# Patient Record
Sex: Male | Born: 2000 | Race: White | Hispanic: No | Marital: Single | Smoking: Never smoker
Health system: Southern US, Community
[De-identification: ages and names within clinical notes are randomized; demographics above are authoritative.]

---
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
## 2018-02-14 ENCOUNTER — Ambulatory Visit (INDEPENDENT_AMBULATORY_CARE_PROVIDER_SITE_OTHER): Payer: Self-pay | Admitting: Family Medicine

## 2018-02-14 DIAGNOSIS — N476 Balanoposthitis: Secondary | ICD-10-CM

## 2018-02-14 NOTE — Progress Notes (Signed)
     Subjective: No chief complaint on file.   HPI: Alex Foley is a 17 y.o. presenting to clinic today to discuss the following:  Penile Swelling Patient presents stating he had penile redness and swelling that began Saturday and self-resolved on Monday. He states it is better now but kept his appointment today to make sure everything is ok. He stated it did feel warm and he reported a subjective fever but no pain, no discharge, no sexual activity. He states he used ice and ibuprofen and it seemed to help. No injuries in the area and no testicular pain or swelling.  Patient states he does not think he can fully retract his foreskin.  Health Maintenance: none     ROS noted in HPI.   Past Medical, Surgical, Social, and Family History Reviewed & Updated per EMR.   Pertinent Historical Findings include:   Social History   Tobacco Use  Smoking Status Never Smoker  Smokeless Tobacco Never Used    Objective: BP 120/82 (BP Location: Right Arm, Patient Position: Sitting, Cuff Size: Large)   Pulse 90   Temp 98.5 F (36.9 C) (Oral)   Ht 5\' 10"  (1.778 m)   Wt (!) 309 lb (140.2 kg)   SpO2 99%   BMI 44.34 kg/m  Vitals and nursing notes reviewed  Physical Exam Gen: Alert and Oriented x 3, NAD HEENT: Normocephalic, atraumatic, PERRLA, EOMI CV: RRR, no murmurs, normal S1, S2 split Resp: CTAB, no wheezing, rales, or rhonchi, comfortable work of breathing GU: No penile swelling, erythema, discharge, or odor noted. Penis is uncircumcised Ext: no clubbing, cyanosis, or edema Skin: warm, dry, intact, no rashes   No results found for this or any previous visit (from the past 72 hour(s)).  Assessment/Plan:  Balanoposthitis Patient symptoms have completely resolved but given history and description of event with history of difficulty retracting his foreskin balanoposthitis seems most likely. Advised patient to work on getting his foreskin fully retracted and to ensure he is  cleaning the area well during showers. Good hygiene should prevent further issues however if it occurs again he may benefit from antibiotic ointment and if continues to fail to have full retraction of foreskin he may benefit from seeing urology.   PATIENT EDUCATION PROVIDED: See AVS    Diagnosis and plan along with any newly prescribed medication(s) were discussed in detail with this patient today. The patient verbalized understanding and agreed with the plan. Patient advised if symptoms worsen return to clinic or ER.   Health Maintainance:   No orders of the defined types were placed in this encounter.   No orders of the defined types were placed in this encounter.    Jules Schick, DO 02/15/2018, 10:12 PM PGY-2 [facility]

## 2018-02-14 NOTE — Patient Instructions (Signed)
It was great to meet you today! Thank you for letting me participate in your care!  Today, we discussed your symptoms that have since self-resolved. I believe what you had is called balanoposthitis. Below is more information on how to manage it and prevent it. If the issue returns please come back into the clinic.   Balanitis Balanitis is swelling and irritation (inflammation) of the head of the penis (glans penis). The condition may also cause inflammation of the skin around the glans penis (foreskin) in men who have not been circumcised. It may develop because of an infection or another medical condition. Balanitis occurs most often among men who have not had their foreskin removed (uncircumcised men). Balanitis sometimes causes scarring of the penis or foreskin, which can require surgery. Untreated balanitis can increase the risk of penile cancer. What are the causes? Common causes of this condition include:  Poor personal hygiene, especially in uncircumcised men. Not cleaning the glans penis and foreskin well can result in buildup of bacteria, viruses, and yeast, which can lead to infection and inflammation.  Irritation and lack of air flow due to fluid (smegma) that can build up on the glans penis.  Other causes include:  Chemical irritation from products such as soaps or shower gels (especially those that have fragrance), condoms, personal lubricants, petroleum jelly, spermicides, or fabric softeners.  Skin conditions, such as eczema, dermatitis, and psoriasis.  Allergies to medicines, such as tetracycline and sulfa drugs.  Certain medical conditions, including liver cirrhosis, congestive heart failure, diabetes, and kidney disease.  Infections, such as candidiasis, HPV (human papillomavirus), herpes simplex, gonorrhea, and syphilis.  Severe obesity.  What increases the risk? The following factors may make you more likely to develop this condition:  Having diabetes. This is the  most common risk factor.  Having a tight foreskin that is difficult to pull back (retract) past the glans.  Having sexual intercourse without using a condom.  What are the signs or symptoms? Symptoms of this condition include:  Discharge from under the foreskin.  A bad smell.  Pain or difficulty retracting the foreskin.  Tenderness, redness, and swelling of the glans.  A rash or sores on the glans or foreskin.  Itchiness.  Inability to get an erection due to pain.  Difficulty urinating.  Scarring of the penis or foreskin, in some cases.  How is this diagnosed? This condition may be diagnosed based on:  A physical exam.  Testing a swab of discharge to check for bacterial or fungal infection.  Blood tests: ? To check for viruses that can cause balanitis. ? To check your blood sugar (glucose) level. High blood glucose could be a sign of diabetes, which can cause balanitis.  How is this treated? Treatment for balanitis depends on the cause. Treatment may include:  Improving personal hygiene. Your health care provider may recommend sitting in a bath of warm water that is deep enough to cover your hips and buttocks (sitz bath).  Medicines such as: ? Creams or ointments to reduce swelling (steroids) or to treat an infection. ? Antibiotic medicine. ? Antifungal medicine.  Surgery to remove or cut the foreskin (circumcision). This may be done if you have scarring on the foreskin that makes it difficult to retract.  Controlling other medical problems that may be causing your condition or making it worse.  Follow these instructions at home:  Do not have sex until the condition clears up, or until your health care provider approves.  Keep your penis  clean and dry. Take sitz baths as recommended by your health care provider.  Avoid products that irritate your skin or make symptoms worse, such as soaps and shower gels that have fragrance.  Take over-the-counter and  prescription medicines only as told by your health care provider. ? If you were prescribed an antibiotic medicine or a cream or ointment, use it as told by your health care provider. Do not stop using your medicine, cream, or ointment even if you start to feel better. ? Do not drive or use heavy machinery while taking prescription pain medicine. Contact a health care provider if:  Your symptoms get worse or do not improve with home care.  You develop chills or a fever.  You have trouble urinating.  You cannot retract your foreskin. Get help right away if:  You develop severe pain.  You are unable to urinate. Summary  Balanitis is inflammation of the head of the penis (glans penis) caused by irritation or infection.  Balanitis causes pain, redness, and swelling of the glans penis.  This condition is most common among uncircumcised men who do not keep their glans penis clean and in men who have diabetes.  Treatment may include creams or ointments.  Good hygiene is important for prevention. This includes pulling back the foreskin when washing your penis. This information is not intended to replace advice given to you by your health care provider. Make sure you discuss any questions you have with your health care provider. Document Released: 10/16/2008 Document Revised: 04/18/2016 Document Reviewed: 04/18/2016 Elsevier Interactive Patient Education  2017 [employer].    Be well, Jules Schick, DO PGY-2, [facility]

## 2018-02-15 DIAGNOSIS — N476 Balanoposthitis: Secondary | ICD-10-CM | POA: Insufficient documentation

## 2018-02-15 NOTE — Assessment & Plan Note (Signed)
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 Advised patient to work on getting his foreskin fully retracted and to ensure he is cleaning the area well during showers. Good hygiene should prevent further issues however if it occurs again he may benefit from antibiotic ointment and if continues to fail to have full retraction of foreskin he may benefit from seeing urology.

## 2018-03-08 ENCOUNTER — Encounter: Payer: Self-pay | Admitting: Family Medicine

## 2018-03-08 ENCOUNTER — Other Ambulatory Visit: Payer: Self-pay

## 2018-03-08 ENCOUNTER — Ambulatory Visit (INDEPENDENT_AMBULATORY_CARE_PROVIDER_SITE_OTHER): Payer: No Typology Code available for payment source | Admitting: Family Medicine

## 2018-03-08 DIAGNOSIS — N471 Phimosis: Secondary | ICD-10-CM | POA: Insufficient documentation

## 2018-03-08 DIAGNOSIS — H6122 Impacted cerumen, left ear: Secondary | ICD-10-CM

## 2018-03-08 DIAGNOSIS — Z23 Encounter for immunization: Secondary | ICD-10-CM | POA: Diagnosis not present

## 2018-03-08 NOTE — Progress Notes (Signed)
   Subjective:   Patient ID: Alex Foley    DOB: 2001/03/10, 17 y.o. male   MRN: [id number]  Chief Complaint: penile discharge   History of Present Illness: Alex Foley is a 17 y.o. male with history of obesity who presents to clinic today complaining of pure white discharge for about 1-2 years now. Patient is concerned that this is not normal and would like to know what it is. Patient states that the discharge is different from his ejaculation; it collects around the head of his penis (uncircumcised) and he has to wipe it away with tissue.   Patient was seen 02/14/2018, about two weeks ago for redness and swelling to glans penis. Patient reports a recurrent episode about one week ago that has resolved. Dr. Karen Foley counseled the patient on hygiene and working to retract his foreskin. Patient states that he has been unable to since his visit; states that he has never been able to fully retract his foreskin.   Patient denies new sexual partners or risk of STD exposure. Patient denies fever, chills, abdominal pain, dysuria, hematuria, urgency, frequency, rash or other lesions to his penis.   Tinnitus Muffled sound and ringing in ears since a beach trip  Patient  Patient admits to using q tips to clean his ear   Review of Systems  Constitutional: Negative for fever.  Gastrointestinal: Negative for abdominal pain, nausea and vomiting.  Genitourinary: Negative for dysuria, frequency, hematuria and urgency.  Skin: Negative for rash.    PMFSH: see epic  No past medical history on file.   Current Outpatient Medications  Medication Sig Dispense Refill  . fexofenadine (ALLEGRA) 180 MG tablet Take 180 mg by mouth daily.    . Selenium Sulfide 2.3 % SHAM Apply 10 mLs topically 2 (two) times a week. 180 mL 3   No current facility-administered medications for this visit.     Objective:  BP 124/76   Pulse 77   Temp 98.7 F (37.1 C) (Oral)   Ht 5\' 10"  (1.778 m)   Wt (!) 312 lb 12.8 oz  (141.9 kg)   SpO2 97%   BMI 44.88 kg/m   Physical Exam  Constitutional: He appears well-developed and well-nourished.  Cardiovascular: Normal rate.  Pulmonary/Chest: Effort normal.    Assessment & Plan:  NABOR THOMANN is a 17 y.o. male who presents to clinic today with phimosis and with a cerumen impaction.  No problem-specific Assessment & Plan notes found for this encounter.  No orders of the defined types were placed in this encounter.  No orders of the defined types were placed in this encounter.   Alex Foley, Medical Student  [facility] 03/08/2018 12:09 PM   I was physically present and or repeated all elements of the H&PE of Alex Foley.  I agree with her documentation and management.  This is a problem focused visit, not a wellness visit.    Alex Foley admantly states he has never had sex so STDs are out of consideration.  He has never been able to retract his foreskin.  I verified on exam.  I could not seen the glans at all.  There is no redness externally nor signs of acute infection  I was able to irrigate the ear clear of a cerumen impaction.  He felt good immediate relief.

## 2018-03-08 NOTE — Patient Instructions (Signed)
I hope that the clean out has solved your ear problem.  Call me in a week or so if you still have ringing or problems.  I would likely add some flonase. I sent in a referral to a urologist.  Someone should call.  If you want to google in advance, the official diagnosis is phimosis.

## 2018-03-09 ENCOUNTER — Encounter: Payer: Self-pay | Admitting: Family Medicine

## 2018-03-09 DIAGNOSIS — H6120 Impacted cerumen, unspecified ear: Secondary | ICD-10-CM | POA: Insufficient documentation

## 2018-03-09 NOTE — Assessment & Plan Note (Signed)
Irrigated left ear clear.  He may have an element of eustacian tube dysfunction.  Return for likely nasal steroids if symptoms persist.

## 2018-03-09 NOTE — Assessment & Plan Note (Signed)
Likely also mild balanitis.  Cannot use topicals because he can't retract.  Warned to not attempt retraction.  Urology referral.

## 2018-04-26 MED FILL — CLOTRIMAZOLE-BETAMETHASONE: 1-0.05 | 10 days supply | Qty: 15 | Fill #0

## 2018-05-25 MED FILL — CHLORHEXIDINE 0.12% RINSE: 0.12 | 14 days supply | Qty: 473 | Fill #0

## 2018-05-25 MED FILL — IBUPROFEN 600 MG TABLET: 600 | 7 days supply | Qty: 30 | Fill #0

## 2018-05-25 MED FILL — HYDROCODON-APAP 5-325: 5-325 | 5 days supply | Qty: 20 | Fill #0

## 2018-05-25 MED FILL — PENICILLIN VK 500 MG TABLET: 500 | 14 days supply | Qty: 56 | Fill #0

## 2019-04-10 ENCOUNTER — Ambulatory Visit (INDEPENDENT_AMBULATORY_CARE_PROVIDER_SITE_OTHER)
Admission: RE | Admit: 2019-04-10 | Discharge: 2019-04-10 | Disposition: A | Discharge status: Home / Self Care | Payer: No Typology Code available for payment source | Source: Ambulatory Visit

## 2019-04-10 ENCOUNTER — Other Ambulatory Visit: Payer: Self-pay

## 2019-04-10 ENCOUNTER — Telehealth: Payer: No Typology Code available for payment source

## 2019-04-10 DIAGNOSIS — J069 Acute upper respiratory infection, unspecified: Secondary | ICD-10-CM | POA: Diagnosis not present

## 2019-04-10 DIAGNOSIS — Z20822 Contact with and (suspected) exposure to covid-19: Secondary | ICD-10-CM

## 2019-04-10 DIAGNOSIS — Z20828 Contact with and (suspected) exposure to other viral communicable diseases: Secondary | ICD-10-CM | POA: Diagnosis not present

## 2019-04-10 MED ORDER — BENZONATATE 100 MG PO CAPS: 100.0000 mg | ORAL_CAPSULE | Freq: Three times a day (TID) | ORAL | 0 refills | Status: DC

## 2019-04-10 MED ORDER — FLUTICASONE PROPIONATE 50 MCG/ACT NA SUSP: 2.0000 | Freq: Every day | NASAL | 0 refills | Status: DC

## 2019-04-10 MED ORDER — CETIRIZINE-PSEUDOEPHEDRINE ER 5-120 MG PO TB12: 1.0000 | ORAL_TABLET | Freq: Every day | ORAL | 0 refills | Status: DC

## 2019-04-10 MED FILL — BENZONATATE 100 MG CAPS: 100 | 7 days supply | Qty: 21 | Fill #0

## 2019-04-10 MED FILL — ZYRTEC-D TABLET: 5-120 | 24 days supply | Qty: 24 | Fill #0

## 2019-04-10 NOTE — Discharge Instructions (Signed)
COVID testing ordered.  Go to the drive-thru testing site at [facility]. They are open M-F 8- 3:45  In the meantime: You should remain isolated in your home for 10 days from symptom onset AND greater than 72 hours after symptoms resolution (absence of fever without the use of fever-reducing medication and improvement in respiratory symptoms), whichever is longer Get plenty of rest and push fluids Tessalon Perles prescribed for cough Zyrtec-D prescribed for nasal congestion, runny nose, and/or sore throat Flonase prescribed for nasal congestion and runny nose Use medications daily for symptom relief Use OTC medications like ibuprofen or tylenol as needed fever or pain Call or go to the ED if you have any new or worsening symptoms such as fever, worsening cough, shortness of breath, chest tightness, chest pain, turning blue, changes in mental status, etc..Marland Kitchen

## 2019-04-10 NOTE — ED Provider Notes (Signed)
[facility] Virtual Visit via Video Note:  BURDETTE FOREHAND  initiated request for Telemedicine visit with [facility] Urgent [facility] team. I connected with Kerry Kass  on 04/10/2019 at 12:10 PM  for a synchronized telemedicine visit using a video enabled HIPPA compliant telemedicine application. I verified that I am speaking with Kerry Kass  using two identifiers. Rennis Harding, PA-C  was physically located in a [facility] Urgent care site and ELIGHA KMETZ was located at a different location.   The limitations of evaluation and management by telemedicine as well as the availability of in-person appointments were discussed. Patient was informed that he  may incur a bill ( including co-pay) for this virtual visit encounter. CANYON LOHR  expressed understanding and gave verbal consent to proceed with virtual visit.   [id number] 04/10/19 Arrival Time: 1157  CC: URI symptoms  SUBJECTIVE: History from: patient.  Alex Foley is a 18 y.o. male who presents with sneezing, congestion, dry cough, and sore throat x 2 days.  Denies sick exposure to COVID, flu or strep.  Denies recent travel.   Has tried benadryl and sinus medication with minimal relief.  Denies aggravating factors.  Denies previous symptoms in the past.   Reports fever with tmax of 100.2 today, and fatigue. Denies chills, SOB, wheezing, chest pain, nausea, vomiting, changes in bowel or bladder habits.    ROS: As per HPI.  All other pertinent ROS negative.     History reviewed. No pertinent past medical history. Past Surgical History:  Procedure Laterality Date  . CHOLECYSTECTOMY N/A 07/02/2017   Procedure: LAPAROSCOPIC CHOLECYSTECTOMY WITH INTRAOPERATIVE CHOLANGIOGRAM;  Surgeon: Ovidio Kin, MD;  Location: [facility];  Service: General;  Laterality: N/A;   No Known Allergies No current facility-administered medications on file prior to encounter.    Current Outpatient Medications on File Prior to Encounter   Medication Sig Dispense Refill  . fexofenadine (ALLEGRA) 180 MG tablet Take 180 mg by mouth daily.    . Selenium Sulfide 2.3 % SHAM Apply 10 mLs topically 2 (two) times a week. 180 mL 3    OBJECTIVE:  There were no vitals filed for this visit.  General appearance: alert; appears mildly fatigued, but nontoxic Eyes: EOMI grossly HENT: normocephalic; atraumatic Neck: supple with FROM Lungs: normal respiratory effort; speaking in full sentences without difficulty; mild cough present Extremities: moves extremities without difficulty Skin: No obvious rashes Neurologic: No facial asymmetries Psychological: alert and cooperative; normal mood and affect  ASSESSMENT & PLAN:  1. Encounter by telehealth for suspected COVID-19   2. Viral URI with cough     Meds ordered this encounter  Medications  . cetirizine-pseudoephedrine (ZYRTEC-D) 5-120 MG tablet    Sig: Take 1 tablet by mouth daily.    Dispense:  30 tablet    Refill:  0    Order Specific Question:   Supervising Provider    Answer:   Eustace Moore [[id number]]  . fluticasone (FLONASE) 50 MCG/ACT nasal spray    Sig: Place 2 sprays into both nostrils daily.    Dispense:  16 g    Refill:  0    Order Specific Question:   Supervising Provider    Answer:   Eustace Moore [[id number]]  . benzonatate (TESSALON) 100 MG capsule    Sig: Take 1 capsule (100 mg total) by mouth every 8 (eight) hours.    Dispense:  21 capsule    Refill:  0    Order  Specific Question:   Supervising Provider    Answer:   Raylene Everts [[id number]]   COVID testing ordered.  Go to the drive-thru testing site at [place]. They are open M-F 8- 3:45  In the meantime: You should remain isolated in your home for 10 days from symptom onset AND greater than 72 hours after symptoms resolution (absence of fever without the use of fever-reducing medication and improvement in respiratory symptoms), whichever is longer Get plenty of rest and push fluids  Tessalon Perles prescribed for cough Zyrtec-D prescribed for nasal congestion, runny nose, and/or sore throat Flonase prescribed for nasal congestion and runny nose Use medications daily for symptom relief Use OTC medications like ibuprofen or tylenol as needed fever or pain Call or go to the ED if you have any new or worsening symptoms such as fever, worsening cough, shortness of breath, chest tightness, chest pain, turning blue, changes in mental status, etc...   I discussed the assessment and treatment plan with the patient. The patient was provided an opportunity to ask questions and all were answered. The patient agreed with the plan and demonstrated an understanding of the instructions.   The patient was advised to call back or seek an in-person evaluation if the symptoms worsen or if the condition fails to improve as anticipated.  I provided 10 minutes of non-face-to-face time during this encounter.  [place], PA-C  04/10/2019 12:10 PM          Stacey Drain, [place], PA-C 04/10/19 1210

## 2019-04-11 LAB — NOVEL CORONAVIRUS, NAA: SARS-CoV-2, NAA: NOT DETECTED

## 2019-08-19 ENCOUNTER — Encounter: Payer: Self-pay | Admitting: Family Medicine

## 2019-08-19 ENCOUNTER — Ambulatory Visit (INDEPENDENT_AMBULATORY_CARE_PROVIDER_SITE_OTHER): Payer: No Typology Code available for payment source | Admitting: Family Medicine

## 2019-08-19 ENCOUNTER — Other Ambulatory Visit: Payer: Self-pay

## 2019-08-19 DIAGNOSIS — F339 Major depressive disorder, recurrent, unspecified: Secondary | ICD-10-CM

## 2019-08-19 DIAGNOSIS — Z9189 Other specified personal risk factors, not elsewhere classified: Secondary | ICD-10-CM | POA: Insufficient documentation

## 2019-08-19 MED ORDER — FLUOXETINE HCL 20 MG PO TABS: 20.0000 mg | ORAL_TABLET | Freq: Every day | ORAL | 3 refills | Status: AC

## 2019-08-19 MED FILL — FLUOXETINE HCL 20 MG TABS: 20 | 30 days supply | Qty: 30 | Fill #0

## 2019-08-19 NOTE — Assessment & Plan Note (Signed)
We discussed being more physically active to help both depression and obesity.

## 2019-08-19 NOTE — Progress Notes (Signed)
    SUBJECTIVE:   CHIEF COMPLAINT / HPI:   Depression: Patient has a history of emotional problems dating back to 2014 - see note of 11/16/2012 with significant school avoidance.  He refused suggested counseling.  With mother, we agree to discuss further in 2 weeks.  My next visit was three years later. He relates current decline to GB surg 06/2017.  Never felt right since.  He enrolled in college, failed two semisters ("I couldn't get motivated to study), has dropped out.  He is not looking for employment or further education at this time.  He spends all day on the computer gaming or in a chat room.   PHQ-9 is 20 and endorses suicidal thoughts.  He has never acted on those thoughts and has no plan.  He now recognizes that he has a problem and is willing to seek help Also note that he has gained 40 more pounds since his GB surgery and now has a BMI=51.    OBJECTIVE:   BP (!) 142/88   Pulse 87   Wt (!) 355 lb 12.8 oz (161.4 kg)   SpO2 97%   BMI 51.05 kg/m   Good eye contact.  Answers questions but does not volunteer expansive answers.  Normal cognition and thought process throughout.  ASSESSMENT/PLAN:   Major depression, recurrent, chronic (HCC) On the good side, he now accepts that he has a problem and is asking for help. On the bad side, this is a long-standing, entrenched problem that will not resolve easily.  He now buys into the combination of medications and counseling.  I will start him on medications.  Referred to counseling.  He promises to make an appointment within two weeks.  He also promises to contact me immediately if self harm thoughts increase.  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  Both make you feel good in the moment but add to your long term problems.  Morbid obesity (HCC) We discussed being more physically active to help both depression and obesity.       Moses Manners, MD [facility]

## 2019-08-19 NOTE — Patient Instructions (Addendum)
Your official diagnosis is major depression. I sent in a medication to the pharmacy.  As we discussed, it will take a while to kick in. I will give you some counseling resources.  Your mom will have other counseling resources available through work.  I do care where you go - just go somewhere.  Get started within the next two weeks. See me in 2-3 weeks to let me know how it is going. I do think you should plan on long term counseling, not short term.  MUCH less time on the computer.  Remember the comparison between computer and the alcoholic - it makes you feel good in the moment, but adds to your long term problems.    Resources: Outpatient Mental Health Providers  (No Insurance at time of Visit or Self Pay)   [facility] Mon-Fri, 8:30-5:00  [address] [place], [place] [address]  [phone] [email or web address]  [phone] (Immediate assistance)   [facility]  Walk-in Mon-Fri, 8am-3pm  [address] [place], [place] [phone] [email or web address]   [facility] [place] (Habla Espanol) walk in M-F 8am-12pm and 1pm-3pm  [place]- [address]   [phone]  [employer] [address] Phone: [phone]   [facility] (Mental Health and substance challenges)  [address]  [place] [phone]  [email or web address]    [facility]  [place] -[address], [place]   Phone: [phone]  [facility]- [address] [phone]   [facility] Walk-in MWF 12:30 to 3:00    [address] [place] [address] [phone] [email or web address]  call to schedule an appointment    [facility] Classes ,Support group, Peer support services,  [address], [place], [place] [address] [phone] PhotoSolver.pl      [facility] on Mental Illness (NAMI) [place]- Wellness classes, Support groups    [address],  [place], [place] [address] [phone]  [email or web address]  [facility] (Psycho-social Rehabilitation clubhouse, Individual and group therapy)  [address] [place], [place] [address]  [phone]- [phone]   24- Hour Availability:  [facility] [phone] or [phone]  * [facility] [employer] (Domestic Violence, Rape, etc. )[phone]  Vesta [employer]er [phone] or [phone]  * [facility] [place] [facility] [phone] only) [phone] (after hours)  *[facility] Mobile Crisis Unit [phone]  *[place] National Suicide Hotline [phone] Len Childs)

## 2019-08-19 NOTE — Assessment & Plan Note (Addendum)
On the good side, he now accepts that he has a problem and is asking for help. On the bad side, this is a long-standing, entrenched problem that will not resolve easily.  He now buys into the combination of medications and counseling.  I will start him on medications.  Referred to counseling.  He promises to make an appointment within two weeks.  He also promises to contact me immediately if self harm thoughts increase.

## 2019-08-19 NOTE — Assessment & Plan Note (Signed)
We discussed the comparison between alcoholism and gaming addiction.  Both make you feel good in the moment but add to your long term problems.

## 2019-09-27 MED FILL — FLUOXETINE HCL 20 MG TABS: 20 | 30 days supply | Qty: 30 | Fill #1

## 2019-11-12 ENCOUNTER — Other Ambulatory Visit: Payer: Self-pay

## 2019-11-12 ENCOUNTER — Telehealth (INDEPENDENT_AMBULATORY_CARE_PROVIDER_SITE_OTHER): Payer: No Typology Code available for payment source | Admitting: Family Medicine

## 2019-11-12 DIAGNOSIS — J069 Acute upper respiratory infection, unspecified: Secondary | ICD-10-CM

## 2019-11-12 NOTE — Progress Notes (Signed)
[facility] Telemedicine Visit  Patient consented to have virtual visit and was identified by name and date of birth. Method of visit: Video  Encounter participants: Patient: Alex Foley - located at home Provider: Levert Feinstein - located at office Others (if applicable): n/a  Chief Complaint: URI sx  HPI:  Patient seen via video visit for cold symptoms. Much of his family was sick last week and he now has symptoms. Has cough, sore throat, sneezing, and drainage from nose. Checked temp and had fever of 100.4. has been sick for 4-5 days. Has tried taking over the counter cough medicine and cough drops which help temporarily. Also tried Financial [employer]. Denies chest pain (other than mild with cough) or difficulty breathing.  Has not yet been vaccinated against COVID but plans to be vaccinated after this illness passes. Sore throat is most bothersome symptom currently.  ROS: per HPI  Pertinent PMHx: depression, otherwise healthy  Exam:  Gen: no acute distress, pleasant, cooperative, overall well appearing Respiratory: Patient speaking normally in full sentences throughout the encounter, without any respiratory distress evident.  HEENT: frequent sniffling of nasal drainage  Assessment/Plan:  Viral URI Almost certainly viral. Recommend COVID testing - [facility] as [facility] COVID testing site is not open today. Advised PCR test, not rapid antigen testing. For symptom control, recommend ibuprofen for sore throat and afrin for nasal congestion.  If PCR COVID test negative and patient still feeling bad in a day or two, can come in and be seen in the office if needed. Advised to call back if worsening. Patient agreeable.  Time spent during visit with patient: 13 minutes

## 2019-11-15 MED FILL — FLUOXETINE HCL 20 MG TABS: 20 | 30 days supply | Qty: 30 | Fill #2

## 2020-01-02 ENCOUNTER — Ambulatory Visit: Payer: No Typology Code available for payment source | Admitting: Psychology

## 2020-01-09 ENCOUNTER — Ambulatory Visit (INDEPENDENT_AMBULATORY_CARE_PROVIDER_SITE_OTHER): Payer: No Typology Code available for payment source | Admitting: Psychology

## 2020-01-09 DIAGNOSIS — F332 Major depressive disorder, recurrent severe without psychotic features: Secondary | ICD-10-CM | POA: Diagnosis not present

## 2020-01-22 ENCOUNTER — Ambulatory Visit (INDEPENDENT_AMBULATORY_CARE_PROVIDER_SITE_OTHER): Payer: No Typology Code available for payment source | Admitting: Psychology

## 2020-01-22 DIAGNOSIS — F332 Major depressive disorder, recurrent severe without psychotic features: Secondary | ICD-10-CM | POA: Diagnosis not present

## 2020-02-05 ENCOUNTER — Ambulatory Visit (INDEPENDENT_AMBULATORY_CARE_PROVIDER_SITE_OTHER): Payer: No Typology Code available for payment source | Admitting: Psychology

## 2020-02-05 DIAGNOSIS — F332 Major depressive disorder, recurrent severe without psychotic features: Secondary | ICD-10-CM

## 2020-02-20 ENCOUNTER — Ambulatory Visit (INDEPENDENT_AMBULATORY_CARE_PROVIDER_SITE_OTHER): Payer: No Typology Code available for payment source | Admitting: Psychology

## 2020-02-20 DIAGNOSIS — F332 Major depressive disorder, recurrent severe without psychotic features: Secondary | ICD-10-CM | POA: Diagnosis not present

## 2020-03-05 ENCOUNTER — Ambulatory Visit (INDEPENDENT_AMBULATORY_CARE_PROVIDER_SITE_OTHER): Payer: No Typology Code available for payment source | Admitting: Psychology

## 2020-03-05 DIAGNOSIS — F332 Major depressive disorder, recurrent severe without psychotic features: Secondary | ICD-10-CM

## 2020-03-12 MED FILL — FLUOXETINE HCL 20 MG TABS: 20 | 30 days supply | Qty: 30 | Fill #3

## 2020-03-19 ENCOUNTER — Ambulatory Visit (INDEPENDENT_AMBULATORY_CARE_PROVIDER_SITE_OTHER): Payer: Self-pay | Admitting: Psychology

## 2020-03-19 DIAGNOSIS — F332 Major depressive disorder, recurrent severe without psychotic features: Secondary | ICD-10-CM

## 2020-04-02 ENCOUNTER — Ambulatory Visit (INDEPENDENT_AMBULATORY_CARE_PROVIDER_SITE_OTHER): Payer: Self-pay | Admitting: Psychology

## 2020-04-02 DIAGNOSIS — F332 Major depressive disorder, recurrent severe without psychotic features: Secondary | ICD-10-CM

## 2020-04-13 ENCOUNTER — Ambulatory Visit: Payer: Self-pay | Admitting: Psychology

## 2020-04-22 ENCOUNTER — Ambulatory Visit (INDEPENDENT_AMBULATORY_CARE_PROVIDER_SITE_OTHER): Payer: Self-pay | Admitting: Psychology

## 2020-04-22 DIAGNOSIS — F332 Major depressive disorder, recurrent severe without psychotic features: Secondary | ICD-10-CM

## 2020-06-03 ENCOUNTER — Ambulatory Visit (INDEPENDENT_AMBULATORY_CARE_PROVIDER_SITE_OTHER): Payer: Self-pay | Admitting: Psychology

## 2020-06-03 DIAGNOSIS — F332 Major depressive disorder, recurrent severe without psychotic features: Secondary | ICD-10-CM

## 2020-06-18 ENCOUNTER — Ambulatory Visit: Payer: Self-pay | Admitting: Psychology

## 2020-06-25 ENCOUNTER — Other Ambulatory Visit: Payer: Self-pay

## 2020-11-12 ENCOUNTER — Other Ambulatory Visit (HOSPITAL_COMMUNITY): Payer: Self-pay

## 2020-11-12 ENCOUNTER — Ambulatory Visit
Admission: EM | Admit: 2020-11-12 | Discharge: 2020-11-12 | Disposition: A | Discharge status: Home / Self Care | Payer: Self-pay | Attending: Emergency Medicine | Admitting: Emergency Medicine

## 2020-11-12 DIAGNOSIS — W540XXA Bitten by dog, initial encounter: Secondary | ICD-10-CM

## 2020-11-12 DIAGNOSIS — R062 Wheezing: Secondary | ICD-10-CM

## 2020-11-12 DIAGNOSIS — Z23 Encounter for immunization: Secondary | ICD-10-CM

## 2020-11-12 DIAGNOSIS — S51851A Open bite of right forearm, initial encounter: Secondary | ICD-10-CM

## 2020-11-12 MED ORDER — AMOXICILLIN-POT CLAVULANATE 875-125 MG PO TABS
1.0000 | ORAL_TABLET | Freq: Two times a day (BID) | ORAL | 0 refills | Status: AC
  Filled 2020-11-12: qty 14, 7d supply, fill #0

## 2020-11-12 MED ORDER — ALBUTEROL SULFATE HFA 108 (90 BASE) MCG/ACT IN AERS
1.0000 | INHALATION_SPRAY | Freq: Four times a day (QID) | RESPIRATORY_TRACT | 0 refills | Status: AC | PRN
  Filled 2020-11-12: qty 18, 25d supply, fill #0

## 2020-11-12 MED ORDER — TETANUS-DIPHTH-ACELL PERTUSSIS 5-2.5-18.5 LF-MCG/0.5 IM SUSY
0.5000 mL | PREFILLED_SYRINGE | Freq: Once | INTRAMUSCULAR | Status: AC
  Administered 2020-11-12: 0.5 mL via INTRAMUSCULAR

## 2020-11-12 MED ORDER — IBUPROFEN 800 MG PO TABS
800.0000 mg | ORAL_TABLET | Freq: Three times a day (TID) | ORAL | 0 refills | Status: AC
  Filled 2020-11-12: qty 21, 7d supply, fill #0

## 2020-11-12 NOTE — ED Notes (Signed)
Patient given Tdap in Right Deltoid.  Tolerated well.

## 2020-11-12 NOTE — ED Provider Notes (Signed)
[facility]    CSN: [id number] Arrival date & time: 11/12/20  6568      History   Chief Complaint Chief Complaint  Patient presents with  . Animal Bite    HPI Alex Foley is a 20 y.o. male presenting today for evaluation of a dog bite.  Reports dog bite to right lower arm approximately 1 hour ago.  Was breaking up a fight between his 2 dogs which are pitbull and boxer.  Sustained abrasions and scratches to bilateral forearms.  Most prominent room to right forearm.  Reports there his dogs and are fully vaccinated for rabies.  Unsure of last tetanus.  Denies difficulty bending or moving arms, hand or fingers.  Denies concern for underlying fracture.  Reports initially after incident had left-sided back pain and shortness of breath, this is resolved.  HPI  History reviewed. No pertinent past medical history.  Patient Active Problem List   Diagnosis Date Noted  . Major depression, recurrent, chronic (HCC) 08/19/2019  . Gaming addiction 08/19/2019  . Seborrhea capitis in adult 11/09/2017  . Morbid obesity (HCC) 11/09/2017  . Environmental allergies 07/29/2016  . Elevated blood pressure reading in office without diagnosis of hypertension 12/31/2015  . S/P laparoscopic cholecystectomy 06/20/2008    Past Surgical History:  Procedure Laterality Date  . CHOLECYSTECTOMY N/A 07/02/2017   Procedure: LAPAROSCOPIC CHOLECYSTECTOMY WITH INTRAOPERATIVE CHOLANGIOGRAM;  Surgeon: Ovidio Kin, MD;  Location: [facility];  Service: General;  Laterality: N/A;       Home Medications    Prior to Admission medications   Medication Sig Start Date End Date Taking? Authorizing Provider  albuterol (VENTOLIN HFA) 108 (90 Base) MCG/ACT inhaler Inhale 1-2 puffs into the lungs every 6 (six) hours as needed for wheezing or shortness of breath. 11/12/20  Yes Burrell Hodapp C, PA-C  amoxicillin-clavulanate (AUGMENTIN) 875-125 MG tablet Take 1 tablet by mouth every 12 (twelve) hours for 7 days.  11/12/20 11/19/20 Yes Robena Ewy C, PA-C  ibuprofen (ADVIL) 800 MG tablet Take 1 tablet (800 mg total) by mouth 3 (three) times daily. 11/12/20  Yes Orenthal Debski C, PA-C  FLUoxetine (PROZAC) 20 MG tablet Take 1 tablet (20 mg total) by mouth daily. 08/19/19   Moses Manners, MD    Family History History reviewed. No pertinent family history.  Social History Social History   Tobacco Use  . Smoking status: Never Smoker  . Smokeless tobacco: Never Used  Substance Use Topics  . Alcohol use: No  . Drug use: No     Allergies   Patient has no known allergies.   Review of Systems Review of Systems  Constitutional: Negative for fatigue and fever.  Eyes: Negative for redness, itching and visual disturbance.  Respiratory: Negative for shortness of breath.   Cardiovascular: Negative for chest pain and leg swelling.  Gastrointestinal: Negative for nausea and vomiting.  Musculoskeletal: Positive for back pain. Negative for arthralgias and myalgias.  Skin: Positive for color change and wound. Negative for rash.  Neurological: Negative for dizziness, syncope, weakness, light-headedness and headaches.     Physical Exam Triage Vital Signs ED Triage Vitals [11/12/20 0936]  Enc Vitals Group     BP      Pulse      Resp      Temp      Temp src      SpO2      Weight      Height      Head Circumference  Peak Flow      Pain Score 4     Pain Loc      Pain Edu?      Excl. in GC?    No data found.  Updated Vital Signs BP 138/86 (BP Location: Left Arm)   Pulse (!) 125   Temp 98.3 F (36.8 C) (Oral)   Resp 16   SpO2 98%   Visual Acuity Right Eye Distance:   Left Eye Distance:   Bilateral Distance:    Right Eye Near:   Left Eye Near:    Bilateral Near:     Physical Exam Vitals and nursing note reviewed.  Constitutional:      Appearance: He is well-developed.     Comments: No acute distress  HENT:     Head: Normocephalic and atraumatic.     Nose: Nose normal.   Eyes:     Conjunctiva/sclera: Conjunctivae normal.  Cardiovascular:     Rate and Rhythm: Normal rate and regular rhythm.  Pulmonary:     Effort: Pulmonary effort is normal. No respiratory distress.     Comments: Mild end expiratory wheezing noted throughout bilateral lung fields posteriorly Abdominal:     General: There is no distension.  Musculoskeletal:        General: Normal range of motion.     Cervical back: Neck supple.     Comments: Right arm: 0.25 cm puncture wound noted to right forearm exposing small amount of subcutaneous fat, edges relatively well reapproximated using small amount of blood, linear abrasion noted to distal anterior forearm that is minimally deep, small other superficial abrasions scattered throughout hand  Full active range of motion of all 5 fingers at IP joints and MCP joints, full active range of motion of wrist and elbow, radial pulse 2+  Left arm: Superficial abrasions noted scattered to forearm and hand, full active range of motion of all 5 fingers at IP joint since MCP joints, radial pulse 2+  Skin:    General: Skin is warm and dry.  Neurological:     Mental Status: He is alert and oriented to person, place, and time.      UC Treatments / Results  Labs (all labs ordered are listed, but only abnormal results are displayed) Labs Reviewed - No data to display  EKG   Radiology No results found.  Procedures Procedures (including critical care time)  Medications Ordered in UC Medications  Tdap (BOOSTRIX) injection 0.5 mL (has no administration in time range)    Initial Impression / Assessment and Plan / UC Course  I have reviewed the triage vital signs and the nursing notes.  Pertinent labs & imaging results that were available during my care of the patient were reviewed by me and considered in my medical decision making (see chart for details).     Wounds overall superficial, expect spontaneous wound healing without intervention to  all wounds, discussed wound care, initiating on Augmentin, will update tetanus today and providing ibuprofen as needed for back pain.  Patient did have some wheezing to auscultation of lungs, providing albuterol inhaler to use as needed for any shortness of breath, chest tightness.  Discussed strict return precautions. Patient verbalized understanding and is agreeable with plan.  Final Clinical Impressions(s) / UC Diagnoses   Final diagnoses:  Dog bite of right forearm without complication, initial encounter  Wheezing     Discharge Instructions     Keep wounds clean and dry, wash with warm soapy water twice daily and  dry well Begin Augmentin twice daily for 1 week to treat help prevent infection Ibuprofen and Tylenol as needed with food for any back pain, arm pain Ice hands and forearms as needed If developing increased wheezing shortness of breath may use albuterol inhaler 1 to 2 puffs every 4-6 hours as needed for shortness of breath chest tightness or wheezing  Please follow-up for any concerns, symptoms not improving or worsening    ED Prescriptions    Medication Sig Dispense Auth. Provider   amoxicillin-clavulanate (AUGMENTIN) 875-125 MG tablet Take 1 tablet by mouth every 12 (twelve) hours for 7 days. 14 tablet Claudeen Leason C, PA-C   ibuprofen (ADVIL) 800 MG tablet Take 1 tablet (800 mg total) by mouth 3 (three) times daily. 21 tablet Amiel Sharrow C, PA-C   albuterol (VENTOLIN HFA) 108 (90 Base) MCG/ACT inhaler Inhale 1-2 puffs into the lungs every 6 (six) hours as needed for wheezing or shortness of breath. 18 g Tavius Turgeon, Mars C, PA-C     PDMP not reviewed this encounter.   Lew Dawes, PA-C 11/12/20 1008

## 2020-11-12 NOTE — Discharge Instructions (Addendum)
Keep wounds clean and dry, wash with warm soapy water twice daily and dry well Begin Augmentin twice daily for 1 week to treat help prevent infection Ibuprofen and Tylenol as needed with food for any back pain, arm pain Ice hands and forearms as needed If developing increased wheezing shortness of breath may use albuterol inhaler 1 to 2 puffs every 4-6 hours as needed for shortness of breath chest tightness or wheezing  Please follow-up for any concerns, symptoms not improving or worsening

## 2020-11-12 NOTE — ED Triage Notes (Signed)
Patient presents to Urgent Care with complaints of a dog bite to right lower arm about an hr ago. 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

## 2021-04-25 NOTE — Progress Notes (Signed)
    SUBJECTIVE:   CHIEF COMPLAINT / HPI: knee pain   Patient reports left knee pain. He reports a fall and injury to left knee in July of 2022 that he did not have examined at that time. Since then he reports experiencing continued symptoms where he characterizes as feeling achy.  Patient states that he fell on a wet surface and scraped his knee.  He has a scar over his left knee.  He localizes the pain to the medial aspect of left knee.  Severity is rated a 6/10 it is worse.  He has taken 400-600 mg of ibuprofen once daily every for many days when his pain is more severe.  He reports that knee is most comfortable when he is seated with his knee flexed at 90 degrees.  He reports that there will seem to be some feeling of pressure with extension of his knee just after the injury.  He denies any bruising at the time.  He denies any prior history of lower extremity fractures or knee injuries.  He reports some discomfort with walking up the stairs especially.  PERTINENT  PMH / PSH:  Depression Obesity  OBJECTIVE:   BP 140/88   Pulse 93   Ht 5\' 10"  (1.778 m)   Wt (!) 377 lb (171 kg)   SpO2 98%   BMI 54.09 kg/m   Knee: - Inspection: no gross deformity, left knee scarring on anterior patella. No swelling/effusion, no erythema or bruising. Skin intact, no warmth to touch - Palpation: no TTP - ROM: full active ROM with flexion and extension in knee and hip - Strength: 5/5 strength - Neuro/vasc: NV intact - Special Tests: - LIGAMENTS: negative anterior and posterior drawer, negative Lachman's, no MCL or LCL laxity  -- MENISCUS:  negative Thessaly  -- PF JOINT: nml patellar mobility bilaterally.  negative patellar grind, negative patellar apprehension  Hips: normal ROM   ASSESSMENT/PLAN:   Left knee pain Left knee pain persistent after fall lump left knee 4 months ago.  Concerned that there may have been a potential avulsion fracture could also consider degenerative changes of meniscus.   We will send patient for left knee x-ray.  Counseled on using heating pad, Voltaren gel, patient to continue ibuprofen every 4-6 hours for pain.  We will plan to follow-up in 2 weeks as well as after results of x-ray return.     , MD [facility]

## 2021-04-26 ENCOUNTER — Other Ambulatory Visit: Payer: Self-pay

## 2021-04-26 ENCOUNTER — Ambulatory Visit (INDEPENDENT_AMBULATORY_CARE_PROVIDER_SITE_OTHER): Payer: Self-pay | Admitting: Family Medicine

## 2021-04-26 ENCOUNTER — Other Ambulatory Visit (HOSPITAL_COMMUNITY): Payer: Self-pay

## 2021-04-26 ENCOUNTER — Ambulatory Visit
Admission: RE | Admit: 2021-04-26 | Discharge: 2021-04-26 | Disposition: A | Discharge status: Home / Self Care | Payer: Self-pay | Source: Ambulatory Visit | Attending: Family Medicine | Admitting: Family Medicine

## 2021-04-26 VITALS — BP 140/88 | HR 93 | Ht 70.0 in | Wt 377.0 lb

## 2021-04-26 DIAGNOSIS — M25562 Pain in left knee: Secondary | ICD-10-CM

## 2021-04-26 DIAGNOSIS — Z23 Encounter for immunization: Secondary | ICD-10-CM

## 2021-04-26 MED ORDER — DICLOFENAC SODIUM 1 % EX GEL
4.0000 g | Freq: Four times a day (QID) | CUTANEOUS | 2 refills | Status: AC
  Filled 2021-04-26: qty 400, 25d supply, fill #0

## 2021-04-26 NOTE — Assessment & Plan Note (Signed)
Left knee pain persistent after fall lump left knee 4 months ago.  Concerned that there may have been a potential avulsion fracture could also consider degenerative changes of meniscus.  We will send patient for left knee x-ray.  Counseled on using heating pad, Voltaren gel, patient to continue ibuprofen every 4-6 hours for pain.  We will plan to follow-up in 2 weeks as well as after results of x-ray return.

## 2021-04-26 NOTE — Patient Instructions (Signed)
For your new pain, please travel to [address], [place] [facility], for x-ray of your left knee.  I will follow-up with you once the results are available.  I have also sent a prescription for overtone gel that she can apply to your knee to help with pain.  I recommend using a heating pad on the knee.  You can continue to use 400-600 mg of ibuprofen as needed to 3 or 4 times a day which would be every 6- 8 hours.  Please follow up with your PCP in 2 weeks.

## 2021-05-04 ENCOUNTER — Other Ambulatory Visit (HOSPITAL_COMMUNITY): Payer: Self-pay

## 2021-11-16 ENCOUNTER — Encounter: Payer: Self-pay | Admitting: *Deleted

## 2022-06-30 IMAGING — DX DG KNEE AP/LAT W/ SUNRISE*L*
4 series · 4 of 4 positions shown · non-contrast
Comparison: No prior.

CLINICAL DATA: Fall.  Knee pain.

EXAM:
LEFT KNEE 3 VIEWS

[dg knee ap/lat w/ sunrise left (1 of 4)]
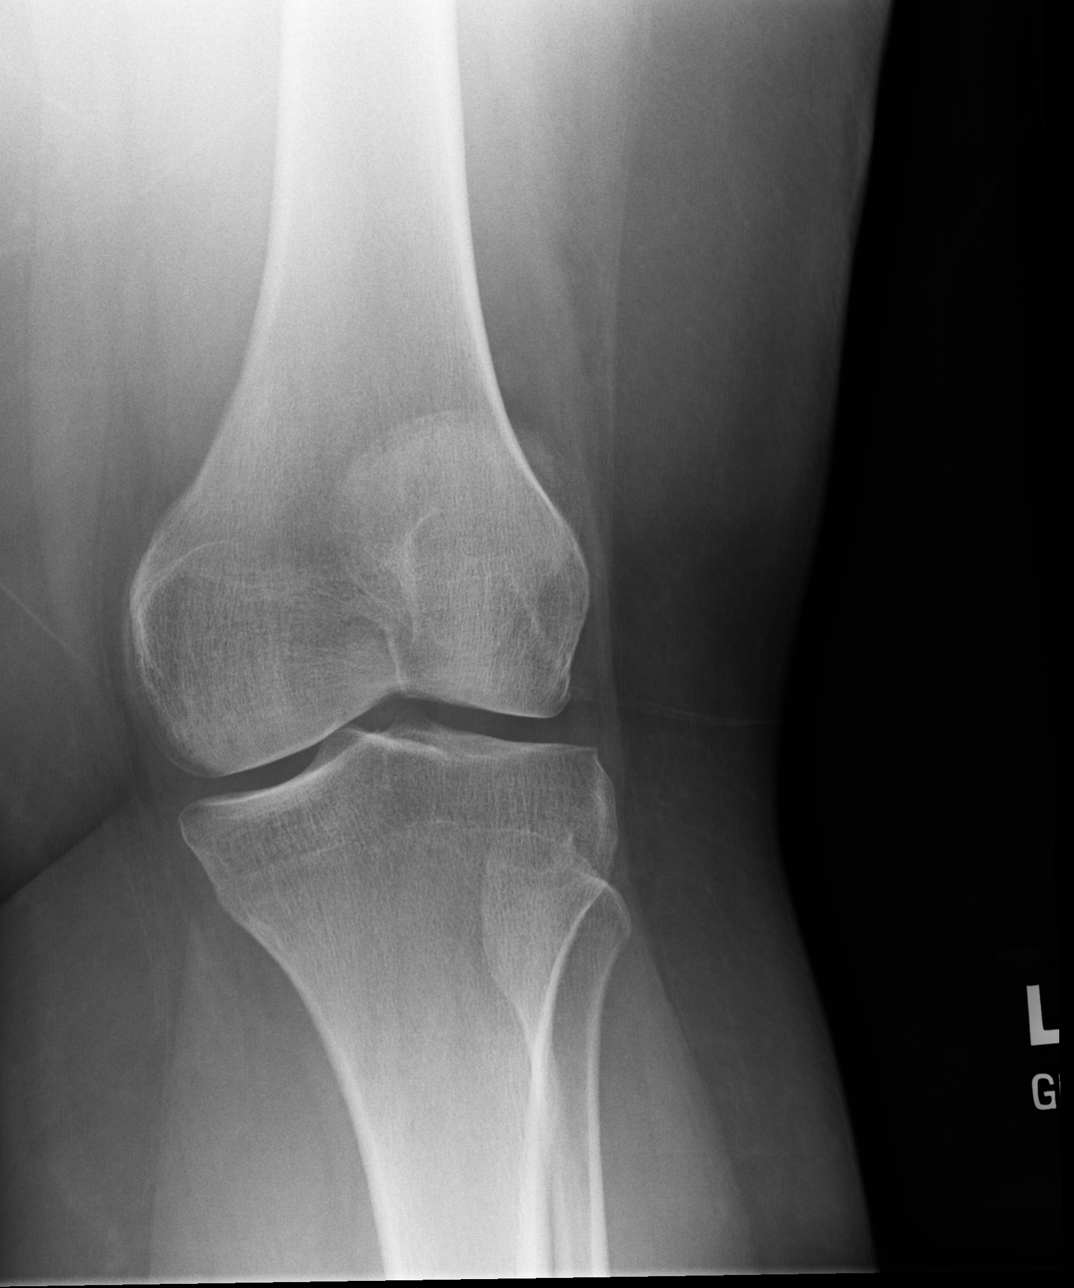

[dg knee ap/lat w/ sunrise left (2 of 4)]
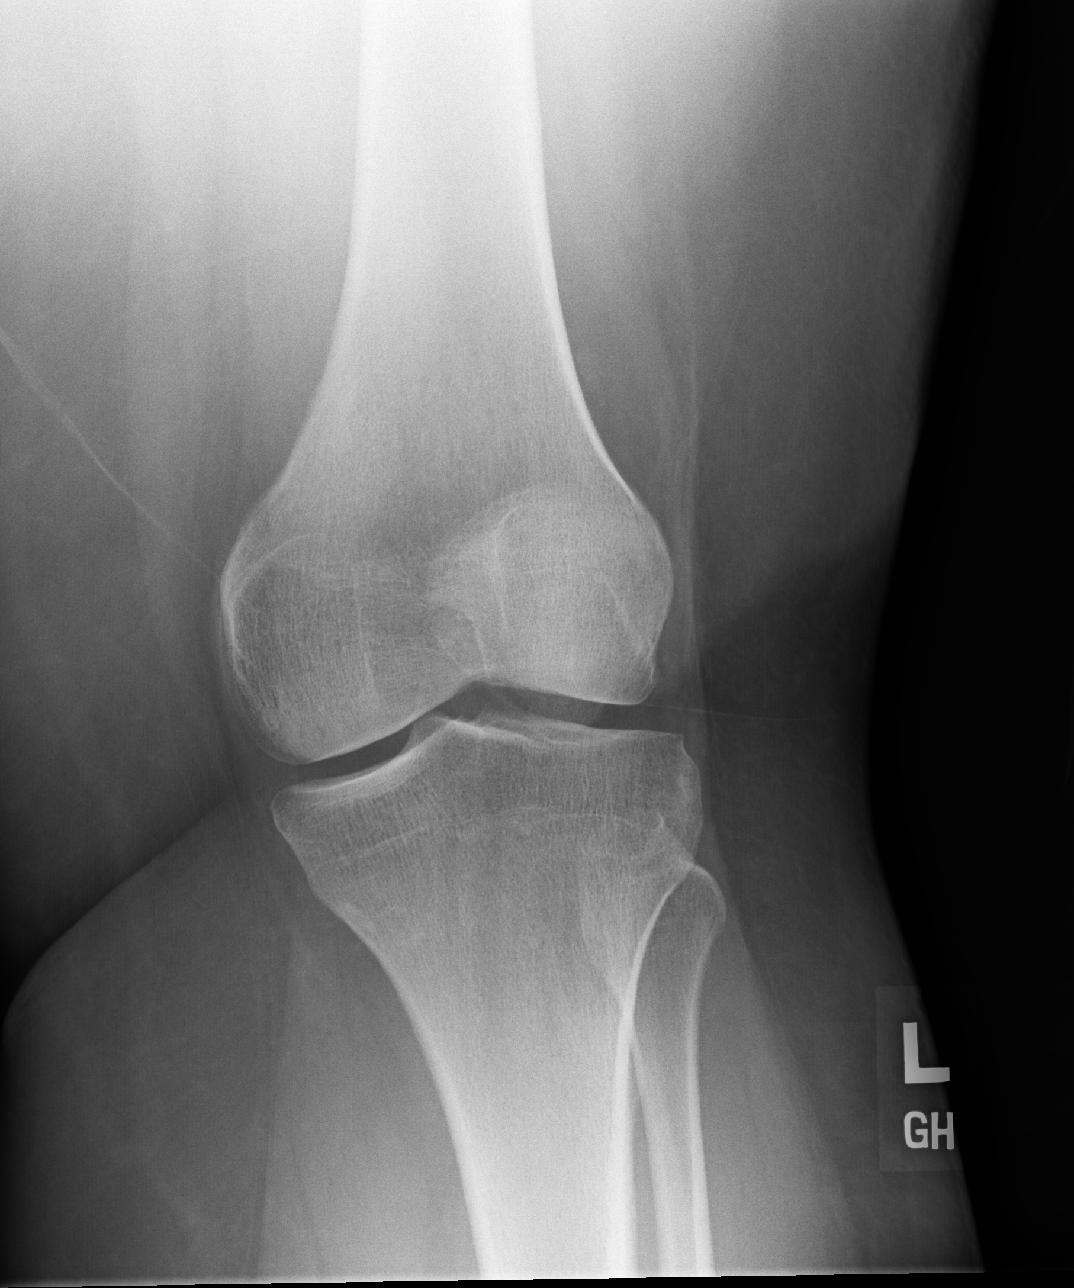

[dg knee ap/lat w/ sunrise left (3 of 4)]
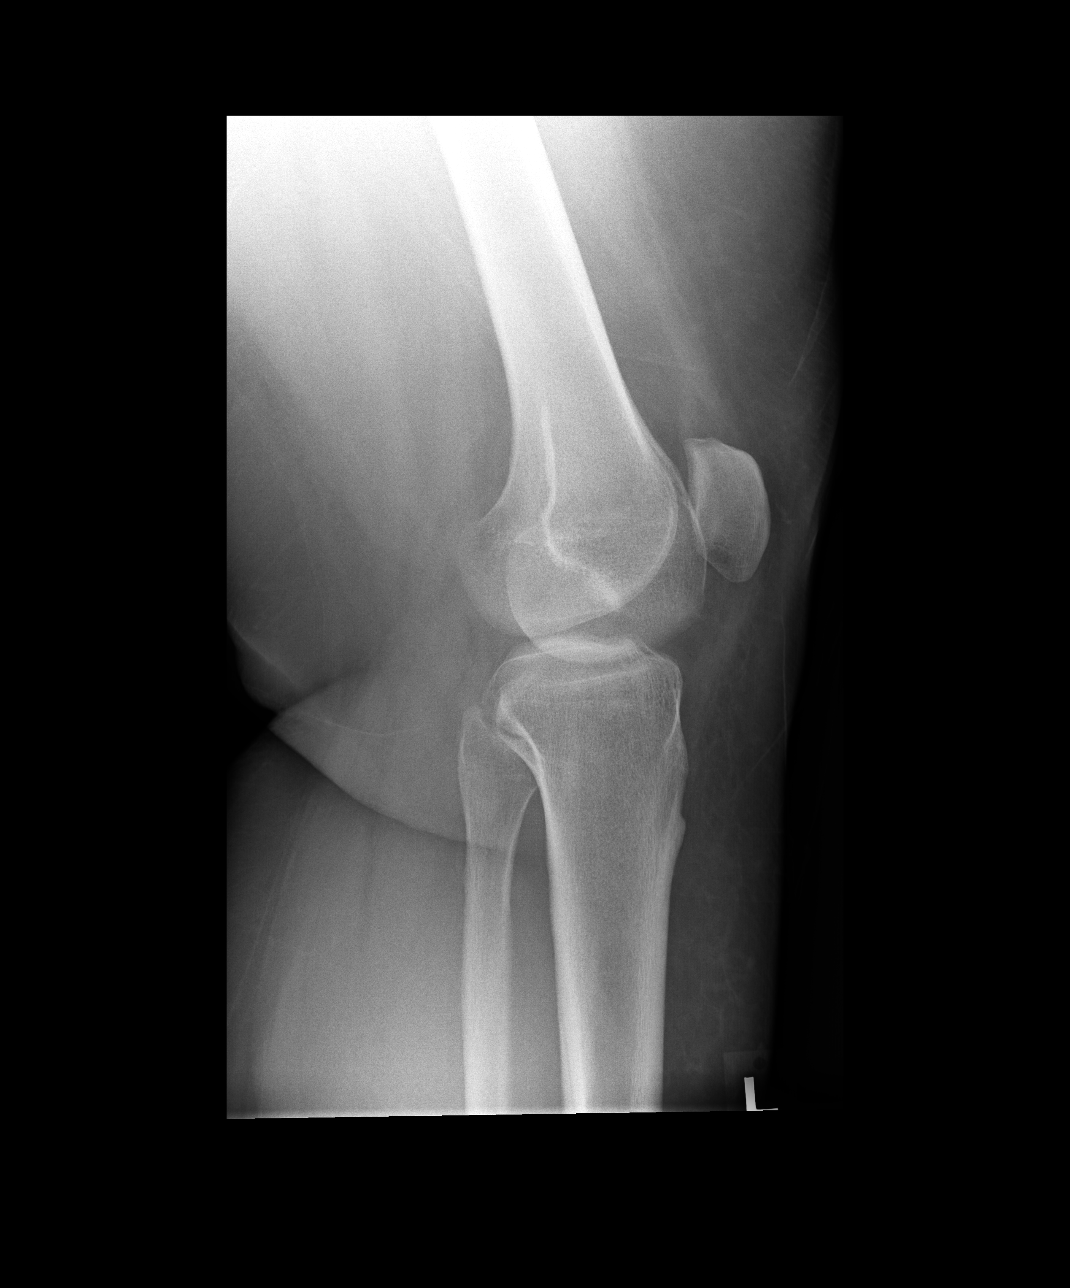

[dg knee ap/lat w/ sunrise left (4 of 4)]
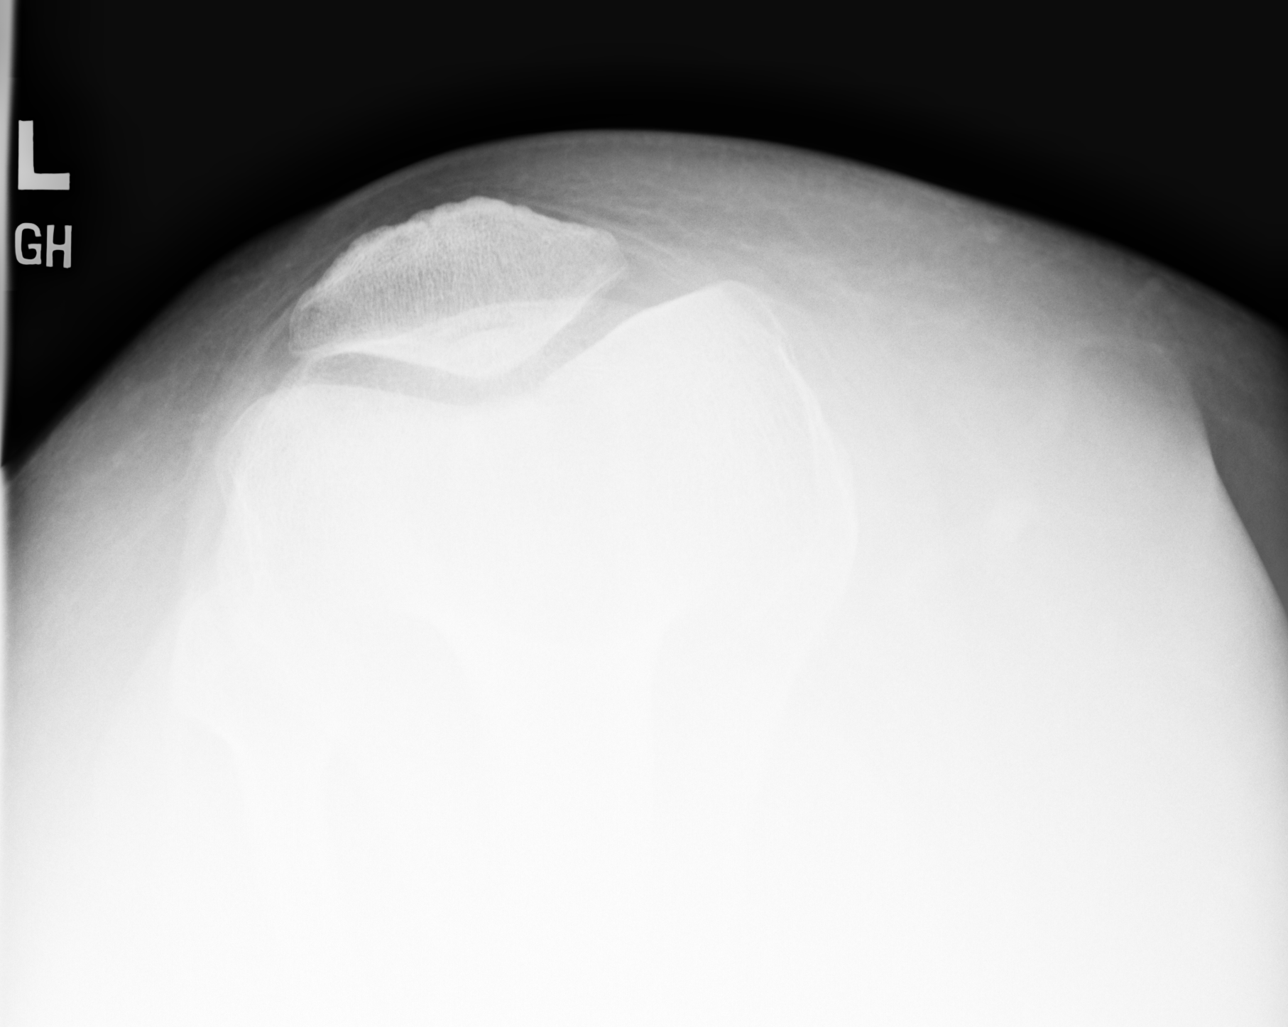

[4 of 4 positions shown; findings below may reference images not displayed]

FINDINGS: Mild tricompartment degenerative change. No acute bony abnormality.
No evidence of fracture dislocation. No evidence of effusion.
IMPRESSION: Mild tricompartment degenerative change.  No acute abnormality.

## 2022-07-19 ENCOUNTER — Encounter: Payer: Self-pay | Admitting: Family Medicine

## 2022-11-08 ENCOUNTER — Ambulatory Visit: Payer: Self-pay | Admitting: Family Medicine

## 2022-11-10 NOTE — Progress Notes (Deleted)
    SUBJECTIVE:   CHIEF COMPLAINT / HPI:   ***  PERTINENT  PMH / PSH: MDD, obesity  OBJECTIVE:   There were no vitals taken for this visit.  ***  ASSESSMENT/PLAN:   No problem-specific Assessment & Plan notes found for this encounter.     Billey Co, MD [facility]

## 2022-11-16 ENCOUNTER — Ambulatory Visit: Payer: Self-pay | Admitting: Family Medicine

## 2024-01-04 ENCOUNTER — Telehealth: Admitting: Physician Assistant

## 2024-01-04 ENCOUNTER — Other Ambulatory Visit (HOSPITAL_COMMUNITY): Payer: Self-pay

## 2024-01-04 DIAGNOSIS — J019 Acute sinusitis, unspecified: Secondary | ICD-10-CM | POA: Diagnosis not present

## 2024-01-04 DIAGNOSIS — B9789 Other viral agents as the cause of diseases classified elsewhere: Secondary | ICD-10-CM

## 2024-01-04 MED ORDER — CETIRIZINE-PSEUDOEPHEDRINE ER 5-120 MG PO TB12
1.0000 | ORAL_TABLET | Freq: Two times a day (BID) | ORAL | 0 refills | Status: AC
  Filled 2024-01-04: qty 12, 6d supply, fill #0

## 2024-01-04 MED ORDER — FLUTICASONE PROPIONATE 50 MCG/ACT NA SUSP
2.0000 | Freq: Every day | NASAL | 6 refills | Status: AC
  Filled 2024-01-04: qty 16, 30d supply, fill #0

## 2024-01-04 NOTE — Progress Notes (Signed)
 E-Visit for Sinus Problems  We are sorry that you are not feeling well.  Here is how we plan to help!  Based on what you have shared with me it looks like you have sinusitis.  Sinusitis is inflammation and infection in the sinus cavities of the head.  Based on your presentation I believe you most likely have Acute Viral Sinusitis.This is an infection most likely caused by a virus. There is not specific treatment for viral sinusitis other than to help you with the symptoms until the infection runs its course.  You may use an oral decongestant such as Mucinex D or if you have glaucoma or high blood pressure use plain Mucinex. Saline nasal spray help and can safely be used as often as needed for congestion, I have prescribed: Fluticasone  nasal spray two sprays in each nostril once a day and Zyrtec  D twice a day.  Some authorities believe that zinc sprays or the use of Echinacea may shorten the course of your symptoms.  Sinus infections are not as easily transmitted as other respiratory infection, however we still recommend that you avoid close contact with loved ones, especially the very young and elderly.  Remember to wash your hands thoroughly throughout the day as this is the number one way to prevent the spread of infection!  Home Care: Only take medications as instructed by your medical team. Do not take these medications with alcohol. A steam or ultrasonic humidifier can help congestion.  You can place a towel over your head and breathe in the steam from hot water coming from a faucet. Avoid close contacts especially the very young and the elderly. Cover your mouth when you cough or sneeze. Always remember to wash your hands.  Get Help Right Away If: You develop worsening fever or sinus pain. You develop a severe head ache or visual changes. Your symptoms persist after you have completed your treatment plan.  Make sure you Understand these instructions. Will watch your condition. Will  get help right away if you are not doing well or get worse.   Thank you for choosing an e-visit.  Your e-visit answers were reviewed by a board certified advanced clinical practitioner to complete your personal care plan. Depending upon the condition, your plan could have included both over the counter or prescription medications.  Please review your pharmacy choice. Make sure the pharmacy is open so you can pick up prescription now. If there is a problem, you may contact your provider through [employer] and have the prescription routed to another pharmacy.  Your safety is important to us . If you have drug allergies check your prescription carefully.   For the next 24 hours you can use MyChart to ask questions about today's visit, request a non-urgent call back, or ask for a work or school excuse. You will get an email in the next two days asking about your experience. I hope that your e-visit has been valuable and will speed your recovery.

## 2024-01-04 NOTE — Progress Notes (Signed)
 I have spent 5 minutes in review of e-visit questionnaire, review and updating patient chart, medical decision making and response to patient.   Laure Kidney, PA-C

## 2024-01-15 ENCOUNTER — Other Ambulatory Visit (HOSPITAL_COMMUNITY): Payer: Self-pay
